# Patient Record
Sex: Female | Born: 1950 | Race: Black or African American | Hispanic: No | Marital: Single | Smoking: Never smoker
Health system: Southern US, Community
[De-identification: ages and names within clinical notes are randomized; demographics above are authoritative.]

## PROBLEM LIST (undated history)

## (undated) DIAGNOSIS — I10 Essential (primary) hypertension: Secondary | ICD-10-CM

## (undated) HISTORY — PX: ABDOMINAL HYSTERECTOMY: SHX81

---

## 2008-09-30 ENCOUNTER — Encounter (INDEPENDENT_AMBULATORY_CARE_PROVIDER_SITE_OTHER): Payer: Self-pay | Admitting: *Deleted

## 2008-11-11 ENCOUNTER — Ambulatory Visit: Payer: Self-pay | Admitting: Family Medicine

## 2008-11-11 ENCOUNTER — Encounter: Payer: Self-pay | Admitting: Family Medicine

## 2008-11-11 DIAGNOSIS — I10 Essential (primary) hypertension: Secondary | ICD-10-CM

## 2008-11-11 LAB — CONVERTED CEMR LAB
ALT: 11 units/L (ref 0–35)
AST: 17 units/L (ref 0–37)
Albumin: 4.2 g/dL (ref 3.5–5.2)
Alkaline Phosphatase: 62 units/L (ref 39–117)
Potassium: 4.5 meq/L (ref 3.5–5.3)
Sodium: 140 meq/L (ref 135–145)
Total Protein: 7.3 g/dL (ref 6.0–8.3)

## 2008-11-12 ENCOUNTER — Telehealth: Payer: Self-pay | Admitting: Family Medicine

## 2008-11-12 ENCOUNTER — Encounter: Payer: Self-pay | Admitting: Family Medicine

## 2008-11-12 DIAGNOSIS — I10 Essential (primary) hypertension: Secondary | ICD-10-CM | POA: Insufficient documentation

## 2008-11-13 ENCOUNTER — Telehealth: Payer: Self-pay | Admitting: *Deleted

## 2008-11-17 ENCOUNTER — Encounter: Admission: RE | Admit: 2008-11-17 | Discharge: 2008-11-17 | Payer: Self-pay | Admitting: Family Medicine

## 2008-12-15 ENCOUNTER — Encounter: Payer: Self-pay | Admitting: Family Medicine

## 2010-07-24 ENCOUNTER — Encounter: Payer: Self-pay | Admitting: *Deleted

## 2012-10-18 ENCOUNTER — Other Ambulatory Visit: Payer: Self-pay

## 2012-10-18 ENCOUNTER — Other Ambulatory Visit (HOSPITAL_COMMUNITY)
Admission: RE | Admit: 2012-10-18 | Discharge: 2012-10-18 | Disposition: A | Discharge status: Home / Self Care | Payer: BC Managed Care – PPO | Source: Ambulatory Visit | Attending: Family Medicine | Admitting: Family Medicine

## 2012-10-18 DIAGNOSIS — E8940 Asymptomatic postprocedural ovarian failure: Secondary | ICD-10-CM

## 2012-10-18 DIAGNOSIS — Z1231 Encounter for screening mammogram for malignant neoplasm of breast: Secondary | ICD-10-CM

## 2012-10-18 DIAGNOSIS — Z0000 Encounter for general adult medical examination without abnormal findings: Secondary | ICD-10-CM | POA: Insufficient documentation

## 2012-10-21 ENCOUNTER — Other Ambulatory Visit: Payer: Self-pay | Admitting: Family Medicine

## 2012-10-21 DIAGNOSIS — E8940 Asymptomatic postprocedural ovarian failure: Secondary | ICD-10-CM

## 2012-11-22 ENCOUNTER — Other Ambulatory Visit: Payer: Self-pay

## 2012-11-22 ENCOUNTER — Ambulatory Visit: Payer: Self-pay

## 2012-12-02 ENCOUNTER — Ambulatory Visit
Admission: RE | Admit: 2012-12-02 | Discharge: 2012-12-02 | Disposition: A | Discharge status: Home / Self Care | Payer: BC Managed Care – PPO | Source: Ambulatory Visit | Attending: Family Medicine | Admitting: Family Medicine

## 2012-12-02 ENCOUNTER — Ambulatory Visit
Admission: RE | Admit: 2012-12-02 | Discharge: 2012-12-02 | Disposition: A | Discharge status: Home / Self Care | Payer: BC Managed Care – PPO | Source: Ambulatory Visit

## 2012-12-02 DIAGNOSIS — E8940 Asymptomatic postprocedural ovarian failure: Secondary | ICD-10-CM

## 2012-12-02 DIAGNOSIS — Z1231 Encounter for screening mammogram for malignant neoplasm of breast: Secondary | ICD-10-CM

## 2013-12-19 ENCOUNTER — Other Ambulatory Visit: Payer: Self-pay

## 2013-12-19 DIAGNOSIS — Z1231 Encounter for screening mammogram for malignant neoplasm of breast: Secondary | ICD-10-CM

## 2013-12-25 ENCOUNTER — Ambulatory Visit
Admission: RE | Admit: 2013-12-25 | Discharge: 2013-12-25 | Disposition: A | Discharge status: Home / Self Care | Payer: BC Managed Care – PPO | Source: Ambulatory Visit

## 2013-12-25 DIAGNOSIS — Z1231 Encounter for screening mammogram for malignant neoplasm of breast: Secondary | ICD-10-CM

## 2015-07-02 ENCOUNTER — Other Ambulatory Visit: Payer: Self-pay

## 2015-07-02 DIAGNOSIS — Z1231 Encounter for screening mammogram for malignant neoplasm of breast: Secondary | ICD-10-CM

## 2015-07-22 ENCOUNTER — Ambulatory Visit
Admission: RE | Admit: 2015-07-22 | Discharge: 2015-07-22 | Disposition: A | Discharge status: Home / Self Care | Payer: Commercial Managed Care - HMO | Source: Ambulatory Visit

## 2015-07-22 DIAGNOSIS — Z1231 Encounter for screening mammogram for malignant neoplasm of breast: Secondary | ICD-10-CM

## 2016-07-31 DIAGNOSIS — Z2821 Immunization not carried out because of patient refusal: Secondary | ICD-10-CM | POA: Insufficient documentation

## 2017-07-03 ENCOUNTER — Other Ambulatory Visit: Payer: Self-pay | Admitting: Family Medicine

## 2017-07-03 DIAGNOSIS — M25561 Pain in right knee: Secondary | ICD-10-CM

## 2017-07-09 ENCOUNTER — Ambulatory Visit
Admission: RE | Admit: 2017-07-09 | Discharge: 2017-07-09 | Disposition: A | Discharge status: Home / Self Care | Payer: Medicare HMO | Source: Ambulatory Visit | Attending: Family Medicine | Admitting: Family Medicine

## 2017-07-09 DIAGNOSIS — M25561 Pain in right knee: Secondary | ICD-10-CM

## 2018-04-12 ENCOUNTER — Other Ambulatory Visit: Payer: Self-pay | Admitting: Family Medicine

## 2018-04-12 DIAGNOSIS — E2839 Other primary ovarian failure: Secondary | ICD-10-CM

## 2018-04-12 DIAGNOSIS — Z1231 Encounter for screening mammogram for malignant neoplasm of breast: Secondary | ICD-10-CM

## 2018-06-20 ENCOUNTER — Ambulatory Visit
Admission: RE | Admit: 2018-06-20 | Discharge: 2018-06-20 | Disposition: A | Discharge status: Home / Self Care | Payer: Medicare HMO | Source: Ambulatory Visit | Attending: Family Medicine | Admitting: Family Medicine

## 2018-06-20 ENCOUNTER — Ambulatory Visit: Payer: Medicare HMO

## 2018-06-20 DIAGNOSIS — E2839 Other primary ovarian failure: Secondary | ICD-10-CM

## 2018-06-20 DIAGNOSIS — Z1231 Encounter for screening mammogram for malignant neoplasm of breast: Secondary | ICD-10-CM

## 2018-11-25 ENCOUNTER — Other Ambulatory Visit: Payer: Self-pay | Admitting: *Deleted

## 2018-11-25 DIAGNOSIS — Z20822 Contact with and (suspected) exposure to covid-19: Secondary | ICD-10-CM

## 2018-11-30 LAB — NOVEL CORONAVIRUS, NAA: SARS-CoV-2, NAA: NOT DETECTED

## 2018-12-04 ENCOUNTER — Telehealth: Payer: Self-pay | Admitting: Family Medicine

## 2018-12-04 NOTE — Telephone Encounter (Signed)
Patient advised that her COVID 19 test results came back negative.

## 2019-06-04 ENCOUNTER — Encounter: Payer: Self-pay | Admitting: Physical Therapy

## 2019-06-04 ENCOUNTER — Ambulatory Visit: Payer: Medicare HMO | Attending: Family Medicine | Admitting: Physical Therapy

## 2019-06-04 ENCOUNTER — Other Ambulatory Visit: Payer: Self-pay

## 2019-06-04 DIAGNOSIS — M25512 Pain in left shoulder: Secondary | ICD-10-CM | POA: Insufficient documentation

## 2019-06-04 DIAGNOSIS — M25612 Stiffness of left shoulder, not elsewhere classified: Secondary | ICD-10-CM | POA: Insufficient documentation

## 2019-06-04 NOTE — Therapy (Signed)
[facility] [address] [place], [place], [address] Phone: [phone]   Fax:  [phone]  Physical Therapy Evaluation  Patient Details  Name: Robin Scott MRN: [id number] Date of Birth: March 06, 1951 Referring Provider (PT): TL Leavy Cella   Encounter Date: 06/04/2019  PT End of Session - 06/04/19 1549    Visit Number  1    Date for PT Re-Evaluation  08/04/19    Authorization Type  [employer]    PT Start Time  1350    PT Stop Time  1440    PT Time Calculation (min)  50 min    Activity Tolerance  Patient tolerated treatment well    Behavior During Therapy  [facility] for tasks assessed/performed       History reviewed. No pertinent past medical history.  History reviewed. No pertinent surgical history.  There were no vitals filed for this visit.   Subjective Assessment - 06/04/19 1406    Subjective  Patient reports a fall about 3 weeks, she reports a trip and injured her left shoulder, she reports that her left upper arm was very bruised.  She did not have an MRI or x-ray.  She reports having a little bit of difficulty dressing and doing hair    Limitations  Lifting;House hold activities    Patient Stated Goals  no pain, better ROM, do everything ADL's without difficulty    Currently in Pain?  Yes    Pain Score  3     Pain Location  Shoulder    Pain Orientation  Left;Lateral    Pain Descriptors / Indicators  Aching    Pain Type  Acute pain    Pain Radiating Towards  denies    Pain Onset  1 to 4 weeks ago    Pain Frequency  Constant    Aggravating Factors   reaching behind, reaching out pain up to 5/10    Pain Relieving Factors  rest helps  at best pain a 2/10    Effect of Pain on Daily Activities  difficulty with ADL's         [facility] PT Assessment - 06/04/19 0001      Assessment   Medical Diagnosis  left shoulder pain    Referring Provider (PT)  TL Leavy Cella    Onset Date/Surgical Date  05/14/19    Hand Dominance   Left      Precautions   Precautions  None      Balance Screen   Has the patient fallen in the past 6 months  Yes    How many times?  1    Has the patient had a decrease in activity level because of a fear of falling?   No    Is the patient reluctant to leave their home because of a fear of falling?   No      Home Environment   Additional Comments  does some housework      Prior Function   Level of Independence  Independent    Vocation  Retired    Leisure  was working out a gym 3x/week      Press photographer Comments  rounded shoulder, fwd head      ROM / Strength   AROM / PROM / Strength  AROM;PROM;Strength      AROM   AROM Assessment Site  Shoulder    Right/Left Shoulder  Left    Left Shoulder Flexion  120 Degrees  Left Shoulder ABduction  90 Degrees    Left Shoulder Internal Rotation  60 Degrees    Left Shoulder External Rotation  70 Degrees      PROM   PROM Assessment Site  Shoulder    Right/Left Shoulder  Left    Left Shoulder Flexion  130 Degrees    Left Shoulder ABduction  120 Degrees    Left Shoulder Internal Rotation  70 Degrees    Left Shoulder External Rotation  80 Degrees      Strength   Overall Strength Comments  ER/IR 4/5, 3+/5 for flexion and abduction      Palpation   Palpation comment  no real tender areas, some in the lateral arm      Special Tests   Other special tests  she was able to give good resistance with the empty can test                Objective measurements completed on examination: See above findings.              PT Education - 06/04/19 1549    Education Details  HEP for AAROM to help with ROM    Person(s) Educated  Patient    Methods  Explanation;Demonstration;Tactile cues;Verbal cues;Handout    Comprehension  Verbalized understanding;Returned demonstration;Verbal cues required       PT Short Term Goals - 06/04/19 1557      PT SHORT TERM GOAL #1   Title  independent with initial HEP     Time  2    Period  Weeks    Status  New        PT Long Term Goals - 06/04/19 1558      PT LONG TERM GOAL #1   Title  understand posture and body mechanics    Time  8    Period  Weeks    Status  New      PT LONG TERM GOAL #2   Title  increase AROM of the left shoulder flexion to 150 degrees    Time  8    Period  Weeks    Status  New      PT LONG TERM GOAL #3   Title  report no difficulty doing her hair    Time  8    Period  Weeks    Status  New      PT LONG TERM GOAL #4   Title  increase left shoulder abduction to 140 degrees    Time  8    Period  Weeks    Status  New             Plan - 06/04/19 1553    Clinical Impression Statement  Patient had a fall about 3 weeks ago, she fell onto her outstretched left arm, she reports some significant bruising in the left upper arm, she is left handed, she reports pain and a loss of ROM, she did not have x-rays or MRI.  She has some limitation in ROM but mostly with flexio and abduction, there is a lot of upper trap compensation to raise the arm, she was able to give good resistance with the arm at her side for ER/IR, empty can test was fair.  I feel like it is significant strain but feel like the ER/Ir is good and the empty can test was good as well so hopefull no RTC    Stability/Clinical Decision Making  Evolving/Moderate complexity    Clinical  Decision Making  Low    Rehab Potential  Good    PT Frequency  1x / week    PT Duration  8 weeks    PT Treatment/Interventions  ADLs/Self Care Home Management;Cryotherapy;Electrical Stimulation;Iontophoresis 4mg /ml Dexamethasone;Moist Heat;Ultrasound;Therapeutic activities;Therapeutic exercise;Manual techniques;Patient/family education    PT Next Visit Plan  work on stabilization and function    Consulted and Agree with Plan of Care  Patient       Patient will benefit from skilled therapeutic intervention in order to improve the following deficits and impairments:  Pain, Improper  body mechanics, Increased muscle spasms, Postural dysfunction, Decreased range of motion, Decreased strength, Impaired UE functional use  Visit Diagnosis: Acute pain of left shoulder - Plan: PT plan of care cert/re-cert  Stiffness of left shoulder, not elsewhere classified - Plan: PT plan of care cert/re-cert     Problem List Patient Active Problem List   Diagnosis Date Noted  . HYPERTENSION 11/12/2008  . HYPERTENSION, BENIGN ESSENTIAL 11/11/2008    Jearld LeschALBRIGHT,Lealer Marsland W., PT 06/04/2019, 4:03 PM  [facility] [address] [place], [place], [address] Phone: [phone]   Fax:  [phone]  Name: Lonell GrandchildValerie Studstill MRN: [id number] Date of Birth: [birth year]

## 2019-06-11 ENCOUNTER — Encounter: Payer: Medicare HMO | Admitting: Physical Therapy

## 2019-06-17 ENCOUNTER — Ambulatory Visit: Payer: Medicare HMO | Attending: Family Medicine | Admitting: Physical Therapy

## 2019-06-17 ENCOUNTER — Encounter: Payer: Self-pay | Admitting: Physical Therapy

## 2019-06-17 ENCOUNTER — Other Ambulatory Visit: Payer: Self-pay

## 2019-06-17 DIAGNOSIS — M25612 Stiffness of left shoulder, not elsewhere classified: Secondary | ICD-10-CM | POA: Insufficient documentation

## 2019-06-17 DIAGNOSIS — M25512 Pain in left shoulder: Secondary | ICD-10-CM | POA: Insufficient documentation

## 2019-06-17 NOTE — Therapy (Signed)
[place], [place], [address] Phone: [phone]   Fax:  [phone]  Physical Therapy Treatment  Patient Details  Name: Robin Scott MRN: [id number] Date of Birth: 1950/09/16 Referring Provider (PT): TL Luciana Axe   Encounter Date: 06/17/2019  PT End of Session - 06/17/19 1428    Visit Number  2    Date for PT Re-Evaluation  08/04/19    Authorization Type  [employer]    PT Start Time  1312    Activity Tolerance  Patient tolerated treatment well    Behavior During Therapy  [facility] for tasks assessed/performed       History reviewed. No pertinent past medical history.  History reviewed. No pertinent surgical history.  There were no vitals filed for this visit.  Subjective Assessment - 06/17/19 1317    Subjective  Patient reports that the mobility is a little better, still in pain    Currently in Pain?  Yes    Pain Score  4     Pain Location  Shoulder    Pain Orientation  Left;Lateral    Aggravating Factors   taking on  and off jacket                       [facility] Adult PT Treatment/Exercise - 06/17/19 0001      Exercises   Exercises  Shoulder      Shoulder Exercises: Standing   External Rotation  Both;20 reps;Theraband    Theraband Level (Shoulder External Rotation)  Level 2 (Red)    Internal Rotation  AAROM;20 reps    Internal Rotation Limitations  with wand behind back to bra    Extension  AAROM;20 reps    Theraband Level (Shoulder Extension)  Level 2 (Red)   20 reps   Extension Limitations  some assist for end range    Row  Both;20 reps;Theraband    Theraband Level (Shoulder Row)  Level 2 (Red)      Shoulder Exercises: ROM/Strengthening   UBE (Upper Arm Bike)  level 1 x 4 minutes    Wall Wash  flexion, CW/CCW circles    Thumb Tacks         Manual Therapy   Manual Therapy  Soft tissue mobilization;Passive ROM    Soft tissue mobilization  to the left upper trap and  into the neck    Passive ROM  left shoulder all motions to end range               PT Short Term Goals - 06/17/19 1438      PT SHORT TERM GOAL #1   Title  independent with initial HEP    Status  Partially Met        PT Long Term Goals - 06/04/19 1558      PT LONG TERM GOAL #1   Title  understand posture and body mechanics    Time  8    Period  Weeks    Status  New      PT LONG TERM GOAL #2   Title  increase AROM of the left shoulder flexion to 150 degrees    Time  8    Period  Weeks    Status  New      PT LONG TERM GOAL #3   Title  report no difficulty doing her hair    Time  8    Period  Weeks  Status  New      PT LONG TERM GOAL #4   Title  increase left shoulder abduction to 140 degrees    Time  8    Period  Weeks    Status  New            Plan - 06/17/19 1428    Clinical Impression Statement  Patient reports that the STM felt good, she is very tight and tends to elevate the shoulders, needs a lot of cues to not elevate the shoulders.  Has significant knots in the left upper trap    PT Next Visit Plan  work on stabilization and function    Consulted and Agree with Plan of Care  Patient       Patient will benefit from skilled therapeutic intervention in order to improve the following deficits and impairments:  Pain, Improper body mechanics, Increased muscle spasms, Postural dysfunction, Decreased range of motion, Decreased strength, Impaired UE functional use  Visit Diagnosis: Acute pain of left shoulder  Stiffness of left shoulder, not elsewhere classified     Problem List Patient Active Problem List   Diagnosis Date Noted  . HYPERTENSION 11/12/2008  . HYPERTENSION, BENIGN ESSENTIAL 11/11/2008    Sumner Boast., PT 06/17/2019, 2:39 PM  [place] [address] [place], [place], [address] Phone: [phone]   Fax:  [phone]  Name: Robin Scott MRN:  [id number] Date of Birth: 26-Aug-1950

## 2019-06-18 ENCOUNTER — Encounter: Payer: Medicare HMO | Admitting: Physical Therapy

## 2019-06-24 ENCOUNTER — Other Ambulatory Visit: Payer: Self-pay

## 2019-06-24 ENCOUNTER — Encounter: Payer: Self-pay | Admitting: Physical Therapy

## 2019-06-24 ENCOUNTER — Ambulatory Visit: Payer: Medicare HMO | Admitting: Physical Therapy

## 2019-06-24 DIAGNOSIS — M25512 Pain in left shoulder: Secondary | ICD-10-CM | POA: Diagnosis not present

## 2019-06-24 DIAGNOSIS — M25612 Stiffness of left shoulder, not elsewhere classified: Secondary | ICD-10-CM

## 2019-06-24 NOTE — Therapy (Signed)
[place], [place], [address] Phone: [phone]   Fax:  [phone]  Physical Therapy Treatment  Patient Details  Name: Robin Scott MRN: [id number] Date of Birth: 1950/12/13 Referring Provider (PT): TL Luciana Axe   Encounter Date: 06/24/2019  PT End of Session - 06/24/19 1408    Visit Number  3    Date for PT Re-Evaluation  08/04/19    Authorization Type  [employer]    PT Start Time  1315    PT Stop Time  1415    PT Time Calculation (min)  60 min    Activity Tolerance  Patient tolerated treatment well    Behavior During Therapy  [facility] for tasks assessed/performed       History reviewed. No pertinent past medical history.  History reviewed. No pertinent surgical history.  There were no vitals filed for this visit.  Subjective Assessment - 06/24/19 1323    Subjective  Patient reports that she is a little more sore and pain in the left upper trap and the neck area, reports at times pain in th eleft upper arm    Currently in Pain?  Yes    Pain Score  4     Pain Location  Shoulder    Pain Orientation  Left    Pain Descriptors / Indicators  Tightness    Pain Radiating Towards  some pain in the left upper arm    Aggravating Factors   taking off clothes         [facility] PT Assessment - 06/24/19 0001      AROM   Left Shoulder Flexion  128 Degrees    Left Shoulder ABduction  110 Degrees    Left Shoulder Internal Rotation  60 Degrees    Left Shoulder External Rotation  70 Degrees                   [facility] Adult PT Treatment/Exercise - 06/24/19 0001      Shoulder Exercises: Standing   Internal Rotation  AAROM;20 reps    Internal Rotation Limitations  with wand behind back to bra    Extension  AAROM;20 reps    Theraband Level (Shoulder Extension)  Level 2 (Red)    Extension Limitations  some assist for end range with wand behind the back      Shoulder Exercises: ROM/Strengthening   UBE (Upper Arm Bike)  level 1 x 4 minutes    Lat Pull  1.5 plate;20 reps    Cybex Row  1.5 plate;10 reps    Wall Wash  flexion, CW/CCW circles    Other ROM/Strengthening Exercises  5# straight arm pulls      Modalities   Modalities  Electrical Stimulation;Moist Heat      Moist Heat Therapy   Number Minutes Moist Heat  15 Minutes    Moist Heat Location  Shoulder      Electrical Stimulation   Electrical Stimulation Location  left upper trap    Electrical Stimulation Action  IFC    Electrical Stimulation Parameters  supine    Electrical Stimulation Goals  Pain      Manual Therapy   Manual Therapy  Soft tissue mobilization;Passive ROM    Soft tissue mobilization  to the left upper trap and into the neck    Passive ROM  left shoulder all motions to end range, with some contract relax  PT Short Term Goals - 06/17/19 1438      PT SHORT TERM GOAL #1   Title  independent with initial HEP    Status  Partially Met        PT Long Term Goals - 06/24/19 1520      PT LONG TERM GOAL #1   Title  understand posture and body mechanics    Status  On-going      PT LONG TERM GOAL #2   Title  increase AROM of the left shoulder flexion to 150 degrees    Status  On-going            Plan - 06/24/19 1518    Clinical Impression Statement  Patient reports that she continue to have the tightness in the upper trap, her ROM for flexion and abduction is much improved.  She struggles with the rotation motions.  I tried some contract relax and added the estim    PT Next Visit Plan  gave info on DN, see how she is progressing, the trigger point is the issue    Consulted and Agree with Plan of Care  Patient       Patient will benefit from skilled therapeutic intervention in order to improve the following deficits and impairments:  Pain, Improper body mechanics, Increased muscle spasms, Postural dysfunction, Decreased range of motion, Decreased strength, Impaired UE  functional use  Visit Diagnosis: Acute pain of left shoulder  Stiffness of left shoulder, not elsewhere classified     Problem List Patient Active Problem List   Diagnosis Date Noted  . HYPERTENSION 11/12/2008  . HYPERTENSION, BENIGN ESSENTIAL 11/11/2008    Sumner Boast., PT 06/24/2019, 3:27 PM  [place] [address] [place], [place], [address] Phone: [phone]   Fax:  [phone]  Name: Raiden Yearwood MRN: [id number] Date of Birth: 09-05-1950

## 2019-06-24 NOTE — Patient Instructions (Signed)

## 2019-07-01 ENCOUNTER — Other Ambulatory Visit: Payer: Self-pay

## 2019-07-01 ENCOUNTER — Ambulatory Visit: Payer: Medicare HMO | Admitting: Physical Therapy

## 2019-07-01 ENCOUNTER — Encounter: Payer: Self-pay | Admitting: Physical Therapy

## 2019-07-01 DIAGNOSIS — M25612 Stiffness of left shoulder, not elsewhere classified: Secondary | ICD-10-CM

## 2019-07-01 DIAGNOSIS — M25512 Pain in left shoulder: Secondary | ICD-10-CM

## 2019-07-01 NOTE — Therapy (Signed)
[place], [place], [address] Phone: [phone]   Fax:  [phone]  Physical Therapy Treatment  Patient Details  Name: Robin Scott MRN: [id number] Date of Birth: 1950/12/01 Referring Provider (PT): TL Luciana Axe   Encounter Date: 07/01/2019  PT End of Session - 07/01/19 1521    Visit Number  4    Date for PT Re-Evaluation  08/04/19    Authorization Type  [employer]    PT Start Time  0626    PT Stop Time  1542    PT Time Calculation (min)  58 min    Activity Tolerance  Patient tolerated treatment well    Behavior During Therapy  [facility] for tasks assessed/performed       History reviewed. No pertinent past medical history.  History reviewed. No pertinent surgical history.  There were no vitals filed for this visit.  Subjective Assessment - 07/01/19 1451    Subjective  Patient reports that she feels like her shoulder is improving but is having more left upper trap pain and pain in the left side of the neck    Currently in Pain?  Yes    Pain Score  4     Pain Location  Neck    Pain Orientation  Left    Pain Relieving Factors  patient feels like what we have been doing has helped                       [facility] Adult PT Treatment/Exercise - 07/01/19 0001      Shoulder Exercises: Standing   External Rotation  Both;20 reps;Theraband    Theraband Level (Shoulder External Rotation)  Level 2 (Red)    Internal Rotation  AAROM;20 reps    Internal Rotation Limitations  with wand behind back to bra    Extension  AAROM;20 reps    Theraband Level (Shoulder Extension)  Level 2 (Red)    Extension Limitations  some assist for end range with wand behind the back    Row  Both;20 reps;Theraband    Theraband Level (Shoulder Row)  Level 2 (Red)    Other Standing Exercises  4# shrugs with upper trap and levator stretches      Shoulder Exercises: ROM/Strengthening   Nustep  level 3 x 5 minutes    Wall Wash  flexion, CW/CCW circles      Moist Heat Therapy   Number Minutes Moist Heat  15 Minutes    Moist Heat Location  Shoulder      Electrical Stimulation   Electrical Stimulation Location  left upper trap into the left shoulder    Electrical Stimulation Action  IFC    Electrical Stimulation Parameters  sitting    Electrical Stimulation Goals  Pain      Manual Therapy   Manual Therapy  Soft tissue mobilization;Passive ROM    Soft tissue mobilization  to the left upper trap and into the neck    Passive ROM  left shoulder all motions to end range, with some contract relax, did some very gentle contract realax and some passive neck stretches               PT Short Term Goals - 06/17/19 1438      PT SHORT TERM GOAL #1   Title  independent with initial HEP    Status  Partially Met        PT Long Term Goals -  07/01/19 1523      PT LONG TERM GOAL #1   Title  understand posture and body mechanics    Status  Partially Met      PT LONG TERM GOAL #2   Title  increase AROM of the left shoulder flexion to 150 degrees    Status  On-going      PT LONG TERM GOAL #3   Title  report no difficulty doing her hair    Status  Partially Met            Plan - 07/01/19 1521    Clinical Impression Statement  shoulder ROM is really improving, the neck is hurting more now, she is very tight and tender in the left upper trap, the left cervical area, she is tight and needed to be gentle with the stretches, she tolerated the shoulder ROM and contract relax very well    PT Next Visit Plan  continue to work on the upper trap and neck area as well as the ROM of the left shoulder    Consulted and Agree with Plan of Care  Patient       Patient will benefit from skilled therapeutic intervention in order to improve the following deficits and impairments:  Pain, Improper body mechanics, Increased muscle spasms, Postural dysfunction, Decreased range of motion, Decreased strength,  Impaired UE functional use  Visit Diagnosis: Acute pain of left shoulder  Stiffness of left shoulder, not elsewhere classified     Problem List Patient Active Problem List   Diagnosis Date Noted  . HYPERTENSION 11/12/2008  . HYPERTENSION, BENIGN ESSENTIAL 11/11/2008    Sumner Boast., PT 07/01/2019, 3:24 PM  [place] [address] [place], [place], [address] Phone: [phone]   Fax:  [phone]  Name: Robin Scott MRN: [id number] Date of Birth: 1950-07-26

## 2019-07-07 ENCOUNTER — Encounter: Payer: Medicare HMO | Admitting: Physical Therapy

## 2019-07-07 ENCOUNTER — Ambulatory Visit: Payer: Medicare HMO | Attending: Family Medicine | Admitting: Physical Therapy

## 2019-07-07 ENCOUNTER — Encounter: Payer: Self-pay | Admitting: Physical Therapy

## 2019-07-07 ENCOUNTER — Other Ambulatory Visit: Payer: Self-pay

## 2019-07-07 DIAGNOSIS — M25612 Stiffness of left shoulder, not elsewhere classified: Secondary | ICD-10-CM | POA: Insufficient documentation

## 2019-07-07 DIAGNOSIS — M25512 Pain in left shoulder: Secondary | ICD-10-CM

## 2019-07-07 NOTE — Therapy (Signed)
[place], [place], [address] Phone: [phone]   Fax:  [phone]  Physical Therapy Treatment  Patient Details  Name: Robin Scott MRN: [id number] Date of Birth: 04/15/1951 Referring Provider (PT): TL Luciana Axe   Encounter Date: 07/07/2019  PT End of Session - 07/07/19 1451    Visit Number  5    Date for PT Re-Evaluation  08/04/19    Authorization Type  [employer]    PT Start Time  3833    PT Stop Time  1458    PT Time Calculation (min)  63 min    Activity Tolerance  Patient tolerated treatment well    Behavior During Therapy  [facility] for tasks assessed/performed       History reviewed. No pertinent past medical history.  History reviewed. No pertinent surgical history.  There were no vitals filed for this visit.  Subjective Assessment - 07/07/19 1353    Subjective  Stiff and sore,    Currently in Pain?  Yes    Pain Score  4     Pain Location  Neck    Pain Orientation  Left    Pain Descriptors / Indicators  Tightness    Aggravating Factors   the neck stretch seemed to make it a little worse         [facility] PT Assessment - 07/07/19 0001      AROM   Left Shoulder Flexion  128 Degrees    Left Shoulder ABduction  110 Degrees    Left Shoulder Internal Rotation  60 Degrees    Left Shoulder External Rotation  70 Degrees      PROM   Left Shoulder Flexion  145 Degrees    Left Shoulder ABduction  130 Degrees                   [facility] Adult PT Treatment/Exercise - 07/07/19 0001      Shoulder Exercises: ROM/Strengthening   Nustep  level 3 x 6 minutes    Lat Pull  1.5 plate;20 reps    Wall Wash  flexion, CW/CCW circles    Other ROM/Strengthening Exercises  5# straight arm pulls      Moist Heat Therapy   Number Minutes Moist Heat  15 Minutes    Moist Heat Location  Shoulder      Electrical Stimulation   Electrical Stimulation Location  left upper trap into the left shoulder     Electrical Stimulation Action  IFC    Electrical Stimulation Parameters  supine    Electrical Stimulation Goals  Pain      Manual Therapy   Manual Therapy  Soft tissue mobilization;Passive ROM;Joint mobilization    Joint Mobilization  gentle grade III all GH motions    Soft tissue mobilization  to the left upper trap and into the neck    Passive ROM  left shoulder all motions to end range, with some contract relax, did some very gentle contract realax and some passive neck stretches               PT Short Term Goals - 07/07/19 1453      PT SHORT TERM GOAL #1   Title  independent with initial HEP    Status  Achieved        PT Long Term Goals - 07/07/19 1454      PT LONG TERM GOAL #1   Title  understand posture and body mechanics  Status  Partially Met      PT LONG TERM GOAL #2   Title  increase AROM of the left shoulder flexion to 150 degrees    Status  On-going      PT LONG TERM GOAL #3   Title  report no difficulty doing her hair    Status  Partially Met      PT LONG TERM GOAL #4   Title  increase left shoulder abduction to 140 degrees    Status  On-going            Plan - 07/07/19 1452    Clinical Impression Statement  Patient having less neck pain today, ROM was measured actively, it did not improve over the past 10 days, however her PROM is improving, she is still very tight in the shoulder, tolerate the contract relax stretches well as well as the joint mobs    PT Next Visit Plan  work on the [employer] and Agree with Plan of Care  Patient       Patient will benefit from skilled therapeutic intervention in order to improve the following deficits and impairments:  Pain, Improper body mechanics, Increased muscle spasms, Postural dysfunction, Decreased range of motion, Decreased strength, Impaired UE functional use  Visit Diagnosis: Acute pain of left shoulder  Stiffness of left shoulder, not elsewhere classified     Problem  List Patient Active Problem List   Diagnosis Date Noted  . HYPERTENSION 11/12/2008  . HYPERTENSION, BENIGN ESSENTIAL 11/11/2008    Sumner Boast., PT 07/07/2019, 2:55 PM  [place], [place], [address] Phone: [phone]   Fax:  [phone]  Name: Robin Scott MRN: [id number] Date of Birth: [birth year]

## 2019-07-17 ENCOUNTER — Ambulatory Visit: Payer: Medicare HMO | Admitting: Physical Therapy

## 2019-07-22 ENCOUNTER — Ambulatory Visit: Payer: Medicare HMO | Admitting: Physical Therapy

## 2019-07-29 ENCOUNTER — Ambulatory Visit: Payer: Medicare HMO | Admitting: Physical Therapy

## 2019-08-20 ENCOUNTER — Ambulatory Visit: Payer: Medicare HMO

## 2019-09-05 ENCOUNTER — Other Ambulatory Visit: Payer: Self-pay | Admitting: Family Medicine

## 2019-09-05 DIAGNOSIS — Z1231 Encounter for screening mammogram for malignant neoplasm of breast: Secondary | ICD-10-CM

## 2019-09-09 ENCOUNTER — Encounter (HOSPITAL_COMMUNITY): Payer: Self-pay | Admitting: Emergency Medicine

## 2019-09-09 ENCOUNTER — Other Ambulatory Visit: Payer: Self-pay

## 2019-09-09 DIAGNOSIS — R519 Headache, unspecified: Secondary | ICD-10-CM | POA: Diagnosis not present

## 2019-09-09 DIAGNOSIS — I10 Essential (primary) hypertension: Secondary | ICD-10-CM | POA: Insufficient documentation

## 2019-09-09 DIAGNOSIS — Z79899 Other long term (current) drug therapy: Secondary | ICD-10-CM | POA: Insufficient documentation

## 2019-09-09 NOTE — ED Triage Notes (Addendum)
Per EMS, patient from home, c/o headache x3 hours. Hx HTN. Reports taking BP meds as prescribed. Took first dose of prednisone tonight. Denies blurred vision, dizziness.   160/130 with EMS  States headache started after taking prednisone.

## 2019-09-10 ENCOUNTER — Emergency Department (HOSPITAL_COMMUNITY)
Admission: EM | Admit: 2019-09-10 | Discharge: 2019-09-10 | Disposition: A | Discharge status: Home / Self Care | Payer: Medicare HMO | Attending: Emergency Medicine | Admitting: Emergency Medicine

## 2019-09-10 DIAGNOSIS — R519 Headache, unspecified: Secondary | ICD-10-CM

## 2019-09-10 HISTORY — DX: Essential (primary) hypertension: I10

## 2019-09-10 MED ORDER — DIPHENHYDRAMINE HCL 50 MG/ML IJ SOLN
12.5000 mg | Freq: Once | INTRAMUSCULAR | Status: AC
  Administered 2019-09-10: 12.5 mg via INTRAVENOUS
  Filled 2019-09-10: qty 1

## 2019-09-10 MED ORDER — PROCHLORPERAZINE EDISYLATE 10 MG/2ML IJ SOLN
10.0000 mg | Freq: Once | INTRAMUSCULAR | Status: AC
  Administered 2019-09-10: 10 mg via INTRAVENOUS
  Filled 2019-09-10: qty 2

## 2019-09-10 NOTE — Discharge Instructions (Addendum)
You were seen today for headache.  You improved with Benadryl and a headache cocktail.  Monitor your symptoms closely at home.  This may or may not be related to your prednisone use.  If you do not tolerate prednisone, you can discontinue use.  Follow-up closely with your primary physician.

## 2019-09-10 NOTE — ED Provider Notes (Signed)
[facility]-EMERGENCY DEPT Provider Note   CSN: [id number] Arrival date & time: 09/09/19  2234     History Chief Complaint  Patient presents with  . Headache    Masiah Lewing is a 69 y.o. female.  HPI     This is a 69 year old female who presents with headache.  Patient with a history of hypertension.  Patient reports that she started prednisone yesterday for a rash on her face.  She has never taken prednisone before.  She had progressively worsening occipital headache.  She states that she does not normally get headaches.  She denies any weakness, numbness, strokelike symptoms, vision changes, dizziness.  She did not take anything for her headache.  She denies any fevers, neck stiffness.  Denies worst headache of her life.  Does have a history of hypertension for which she takes lisinopril.  Currently she rates her pain at 5 out of 10.  It is occipital and nonradiating.  It is dull and achy in nature.  Past Medical History:  Diagnosis Date  . Hypertension     Patient Active Problem List   Diagnosis Date Noted  . Pneumococcal vaccination declined by patient 07/31/2016  . Essential hypertension 11/11/2008    History reviewed. No pertinent surgical history.   OB History   No obstetric history on file.     No family history on file.  Social History   Tobacco Use  . Smoking status: Not on file  Substance Use Topics  . Alcohol use: Not on file  . Drug use: Not on file    Home Medications Prior to Admission medications   Medication Sig Start Date End Date Taking? Authorizing Provider  losartan (COZAAR) 25 MG tablet Take 1 tablet by mouth daily. 06/24/19   [provider]    Allergies    Patient has no known allergies.  Review of Systems   Review of Systems  Constitutional: Negative for fever.  Eyes: Negative for photophobia and visual disturbance.  Respiratory: Negative for shortness of breath.   Cardiovascular: Negative for  chest pain.  Gastrointestinal: Negative for abdominal pain, nausea and vomiting.  Musculoskeletal: Negative for back pain.  Neurological: Positive for headaches. Negative for dizziness, weakness and numbness.  All other systems reviewed and are negative.   Physical Exam Updated Vital Signs BP (!) 173/111   Pulse 86   Temp 98 F (36.7 C) (Oral)   Resp 11   SpO2 97%   Physical Exam Vitals and nursing note reviewed.  Constitutional:      Appearance: She is well-developed.  HENT:     Head: Normocephalic and atraumatic.  Eyes:     Pupils: Pupils are equal, round, and reactive to light.     Comments: Slight swelling and darkened discoloration bilateral eyes  Cardiovascular:     Rate and Rhythm: Normal rate and regular rhythm.     Heart sounds: Normal heart sounds.  Pulmonary:     Effort: Pulmonary effort is normal. No respiratory distress.     Breath sounds: No wheezing.  Abdominal:     General: Bowel sounds are normal.     Palpations: Abdomen is soft.  Musculoskeletal:     Cervical back: Normal range of motion and neck supple.  Skin:    General: Skin is warm and dry.  Neurological:     Mental Status: She is alert and oriented to person, place, and time.     Comments: Cranial nerves II through XII intact, fluent speech, 5  out of 5 strength in all 4 extremities, no dysmetria to finger-nose-finger  Psychiatric:        Mood and Affect: Mood normal.     ED Results / Procedures / Treatments   Labs (all labs ordered are listed, but only abnormal results are displayed) Labs Reviewed - No data to display  EKG None  Radiology No results found.  Procedures Procedures (including critical care time)  Medications Ordered in ED Medications  prochlorperazine (COMPAZINE) injection 10 mg (10 mg Intravenous Given 09/10/19 0200)  diphenhydrAMINE (BENADRYL) injection 12.5 mg (12.5 mg Intravenous Given 09/10/19 0200)    ED Course  I have reviewed the triage vital signs and the  nursing notes.  Pertinent labs & imaging results that were available during my care of the patient were reviewed by me and considered in my medical decision making (see chart for details).    MDM Rules/Calculators/A&P                       Patient presents with onset of headache.  Reports after starting prednisone yesterday.  She is overall nontoxic-appearing.  She is notably hypertensive with initial blood pressure of 179/114.  She is has no neurologic deficits no red flags with the exception of age.  She is neurologically intact.  She does report a rash over the last month around her eyes.  It is itchy.  She has not taken Benadryl or any antihistamines for this.  This is what she was prescribed prednisone for.  Patient was given Benadryl and Compazine.  At this time have lower suspicion for hypertensive urgency or emergency.  Feel that subarachnoid hemorrhage is unlikely.  2:50 AM On recheck, patient states she feels much better and headache is a 0.  Recommend close follow-up with her primary physician.  After history, exam, and medical workup I feel the patient has been appropriately medically screened and is safe for discharge home. Pertinent diagnoses were discussed with the patient. Patient was given return precautions.   Final Clinical Impression(s) / ED Diagnoses Final diagnoses:  Bad headache    Rx / DC Orders ED Discharge Orders    None       Norma Montemurro, Barbette Hair, MD 09/10/19 [phone]

## 2019-09-26 ENCOUNTER — Ambulatory Visit: Payer: Medicare HMO

## 2019-10-21 ENCOUNTER — Ambulatory Visit
Admission: RE | Admit: 2019-10-21 | Discharge: 2019-10-21 | Disposition: A | Discharge status: Home / Self Care | Payer: Medicare HMO | Source: Ambulatory Visit | Attending: Family Medicine | Admitting: Family Medicine

## 2019-10-21 ENCOUNTER — Other Ambulatory Visit: Payer: Self-pay

## 2019-10-21 DIAGNOSIS — Z1231 Encounter for screening mammogram for malignant neoplasm of breast: Secondary | ICD-10-CM

## 2019-12-15 ENCOUNTER — Ambulatory Visit: Payer: Self-pay | Admitting: Allergy

## 2019-12-17 ENCOUNTER — Ambulatory Visit: Payer: Self-pay | Admitting: Allergy

## 2020-06-15 IMAGING — MG DIGITAL SCREENING BILAT W/ TOMO W/ CAD
8 series · 8 of 24 positions shown · non-contrast
Comparison: Previous exam(s).

CLINICAL DATA: Screening.

EXAM:
DIGITAL SCREENING BILATERAL MAMMOGRAM WITH TOMO AND CAD

[R CC synth-2D]
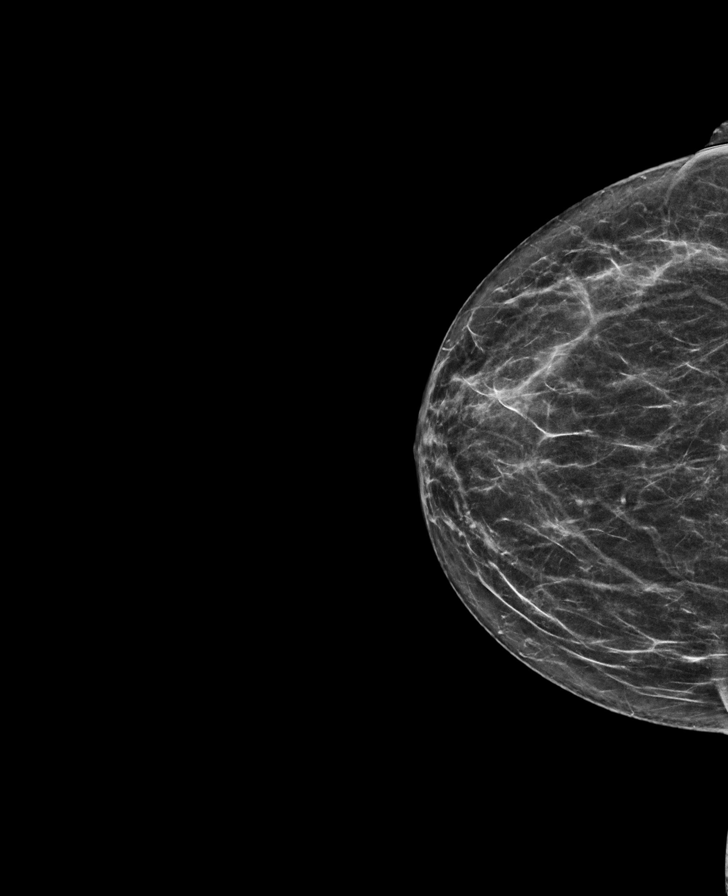

[R MLO synth-2D]
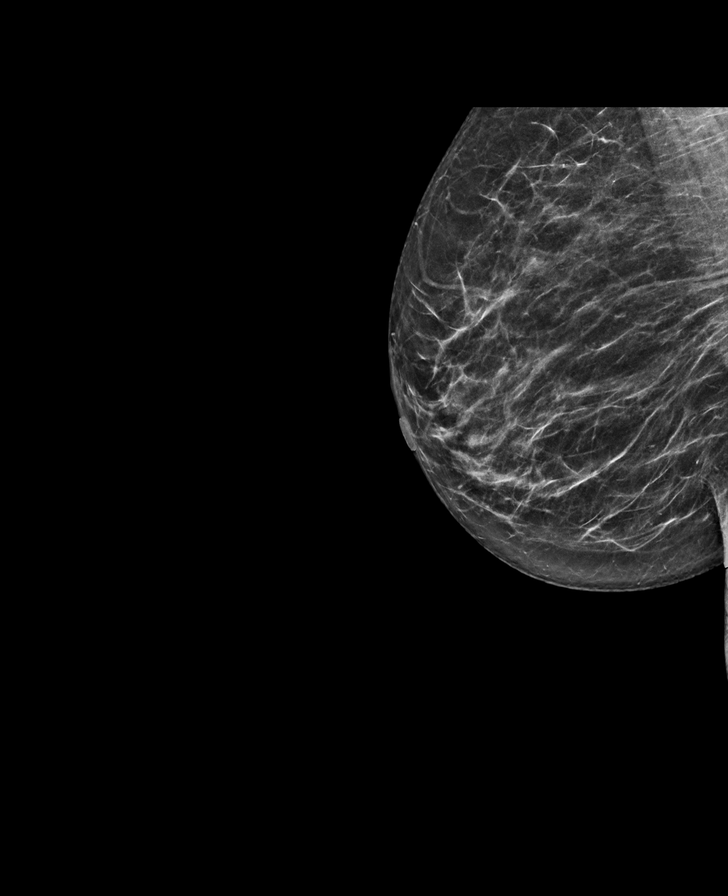

[L MLO synth-2D]
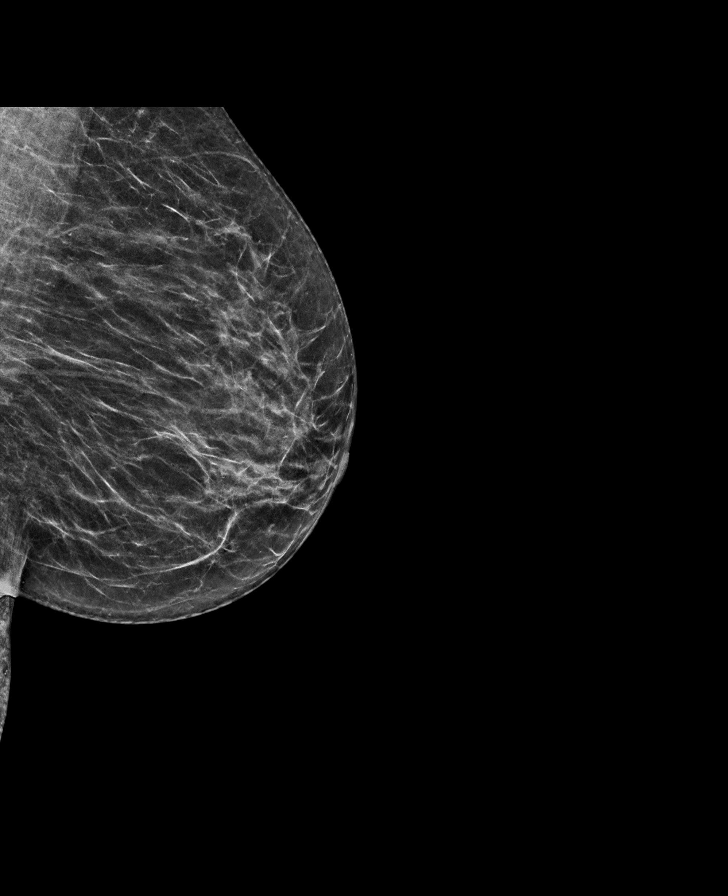

[L CC synth-2D]
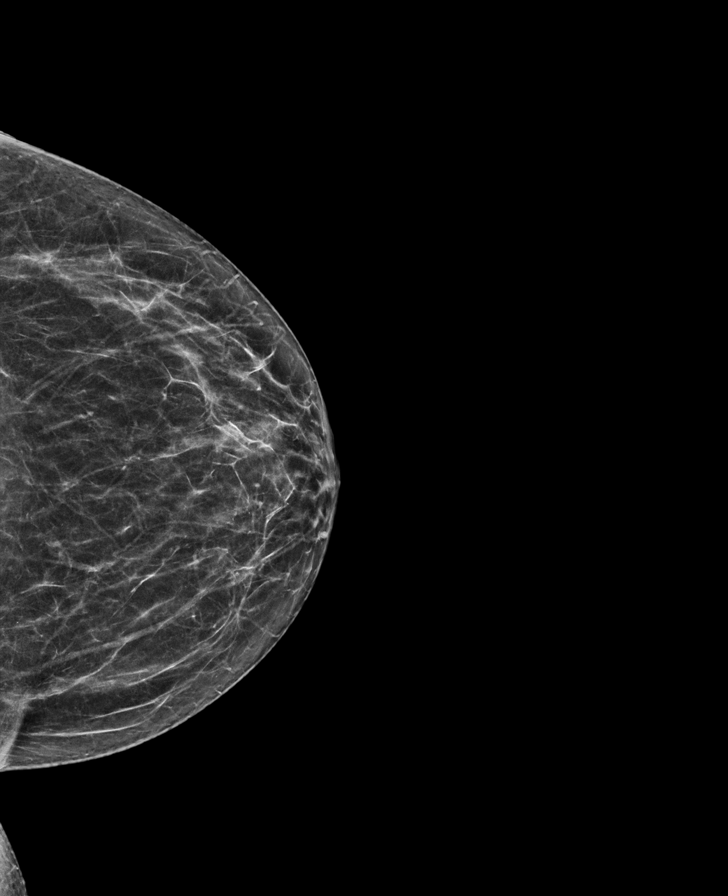

[R CC tomo · tomo slice 30/59.0]
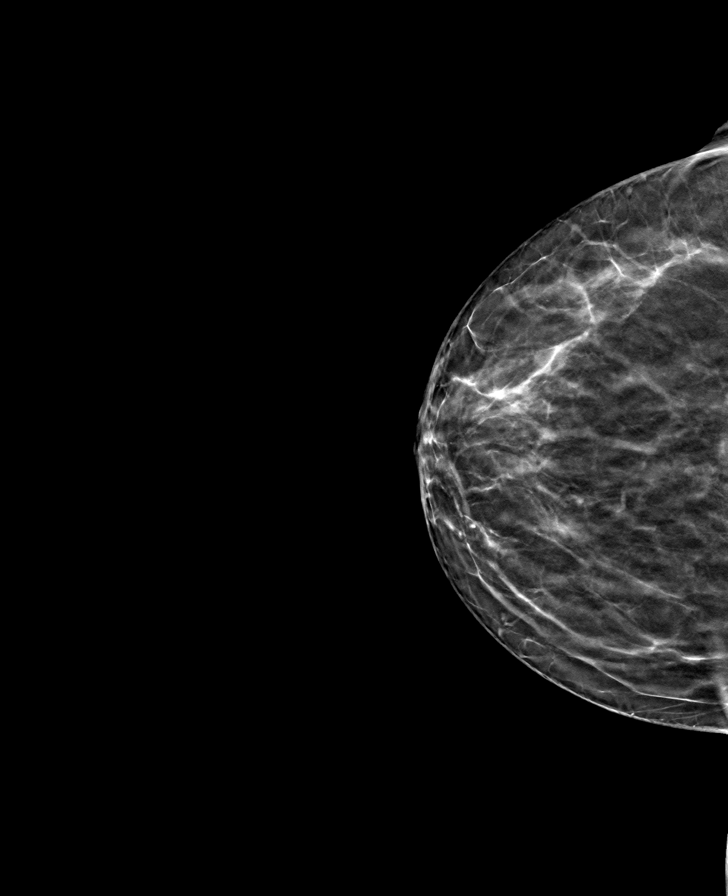

[R MLO tomo · tomo slice 29/58.0]
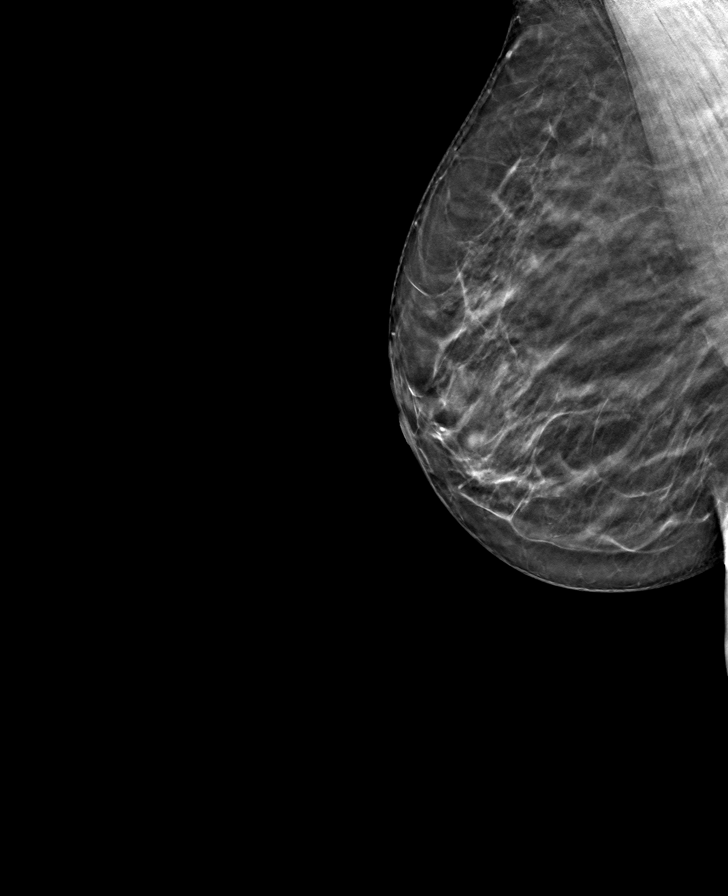

[L MLO tomo · tomo slice 30/59.0]
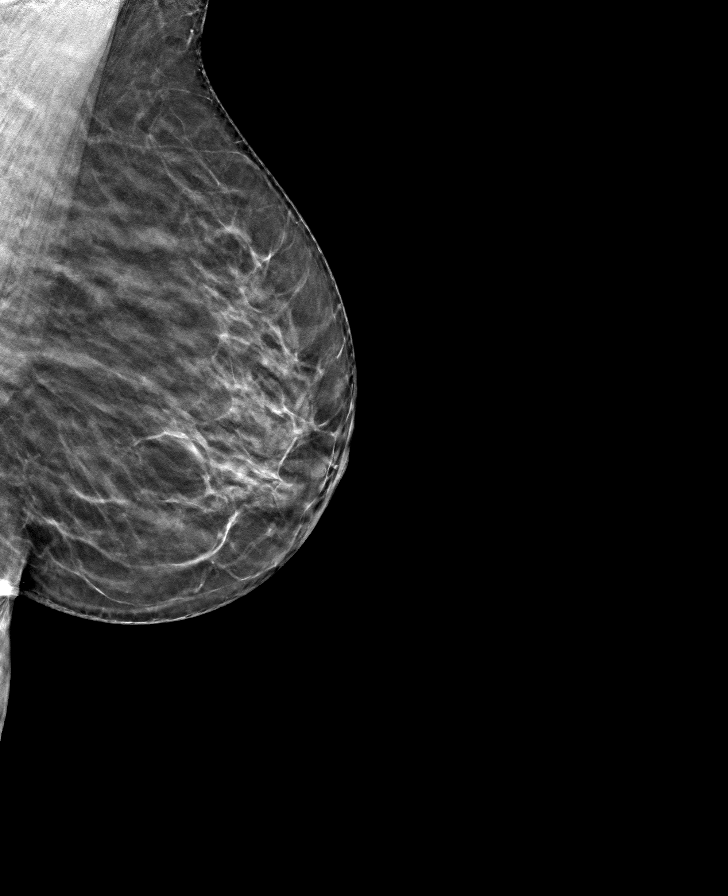

[L CC tomo · tomo slice 29/56.0]
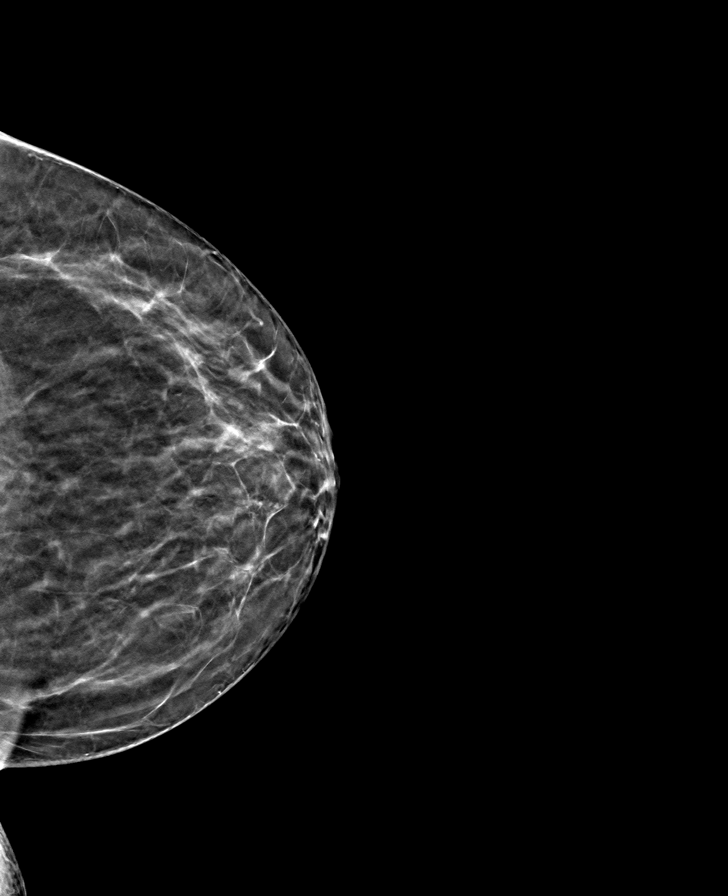

[8 of 24 positions shown; findings below may reference images not displayed]

ACR Breast Density Category c: The breast tissue is heterogeneously
dense, which may obscure small masses.
FINDINGS: There are no findings suspicious for malignancy. Images were
processed with CAD.
IMPRESSION: No mammographic evidence of malignancy. A result letter of this
screening mammogram will be mailed directly to the patient.

RECOMMENDATION:
Screening mammogram in one year. (Code:[id number]-[id number])

BI-RADS CATEGORY  1: Negative.

## 2020-10-26 ENCOUNTER — Ambulatory Visit
Admission: EM | Admit: 2020-10-26 | Discharge: 2020-10-26 | Disposition: A | Discharge status: Home / Self Care | Payer: Medicare HMO

## 2020-10-26 ENCOUNTER — Other Ambulatory Visit: Payer: Self-pay

## 2020-10-26 ENCOUNTER — Emergency Department (HOSPITAL_COMMUNITY): Payer: Medicare HMO

## 2020-10-26 ENCOUNTER — Emergency Department (HOSPITAL_COMMUNITY)
Admission: EM | Admit: 2020-10-26 | Discharge: 2020-10-27 | Disposition: A | Discharge status: Home / Self Care | Payer: Medicare HMO | Attending: Emergency Medicine | Admitting: Emergency Medicine

## 2020-10-26 ENCOUNTER — Encounter (HOSPITAL_COMMUNITY): Payer: Self-pay | Admitting: Emergency Medicine

## 2020-10-26 DIAGNOSIS — R112 Nausea with vomiting, unspecified: Secondary | ICD-10-CM | POA: Insufficient documentation

## 2020-10-26 DIAGNOSIS — I517 Cardiomegaly: Secondary | ICD-10-CM

## 2020-10-26 DIAGNOSIS — E876 Hypokalemia: Secondary | ICD-10-CM | POA: Diagnosis not present

## 2020-10-26 DIAGNOSIS — R748 Abnormal levels of other serum enzymes: Secondary | ICD-10-CM | POA: Insufficient documentation

## 2020-10-26 DIAGNOSIS — R1010 Upper abdominal pain, unspecified: Secondary | ICD-10-CM | POA: Diagnosis not present

## 2020-10-26 DIAGNOSIS — R778 Other specified abnormalities of plasma proteins: Secondary | ICD-10-CM

## 2020-10-26 DIAGNOSIS — I10 Essential (primary) hypertension: Secondary | ICD-10-CM | POA: Insufficient documentation

## 2020-10-26 DIAGNOSIS — R197 Diarrhea, unspecified: Secondary | ICD-10-CM | POA: Insufficient documentation

## 2020-10-26 LAB — CBC WITH DIFFERENTIAL/PLATELET
Abs Immature Granulocytes: 0.06 10*3/uL (ref 0.00–0.07)
Basophils Absolute: 0 10*3/uL (ref 0.0–0.1)
Basophils Relative: 0 %
Eosinophils Absolute: 0 10*3/uL (ref 0.0–0.5)
Eosinophils Relative: 0 %
HCT: 41.5 % (ref 36.0–46.0)
Hemoglobin: 14 g/dL (ref 12.0–15.0)
Immature Granulocytes: 1 %
Lymphocytes Relative: 13 %
Lymphs Abs: 1.7 10*3/uL (ref 0.7–4.0)
MCH: 27.3 pg (ref 26.0–34.0)
MCHC: 33.7 g/dL (ref 30.0–36.0)
MCV: 80.9 fL (ref 80.0–100.0)
Monocytes Absolute: 1.2 10*3/uL — ABNORMAL HIGH (ref 0.1–1.0)
Monocytes Relative: 9 %
Neutro Abs: 10.1 10*3/uL — ABNORMAL HIGH (ref 1.7–7.7)
Neutrophils Relative %: 77 %
Platelets: 308 10*3/uL (ref 150–400)
RBC: 5.13 MIL/uL — ABNORMAL HIGH (ref 3.87–5.11)
RDW: 13.6 % (ref 11.5–15.5)
WBC: 13.1 10*3/uL — ABNORMAL HIGH (ref 4.0–10.5)
nRBC: 0 % (ref 0.0–0.2)

## 2020-10-26 LAB — COMPREHENSIVE METABOLIC PANEL
ALT: 14 U/L (ref 0–44)
AST: 19 U/L (ref 15–41)
Albumin: 4.1 g/dL (ref 3.5–5.0)
Alkaline Phosphatase: 40 U/L (ref 38–126)
Anion gap: 12 (ref 5–15)
BUN: 10 mg/dL (ref 8–23)
CO2: 29 mmol/L (ref 22–32)
Calcium: 9.8 mg/dL (ref 8.9–10.3)
Chloride: 91 mmol/L — ABNORMAL LOW (ref 98–111)
Creatinine, Ser: 0.73 mg/dL (ref 0.44–1.00)
GFR, Estimated: >60 mL/min (ref >60)
Glucose, Bld: 124 mg/dL — ABNORMAL HIGH (ref 70–99)
Potassium: 2.8 mmol/L — ABNORMAL LOW (ref 3.5–5.1)
Sodium: 132 mmol/L — ABNORMAL LOW (ref 135–145)
Total Bilirubin: 0.9 mg/dL (ref 0.3–1.2)
Total Protein: 7.8 g/dL (ref 6.5–8.1)

## 2020-10-26 LAB — TROPONIN I (HIGH SENSITIVITY): Troponin I (High Sensitivity): 30 ng/L — ABNORMAL HIGH (ref <18)

## 2020-10-26 LAB — LIPASE, BLOOD: Lipase: 22 U/L (ref 11–51)

## 2020-10-26 NOTE — ED Provider Notes (Signed)
Emergency Medicine Provider Triage Evaluation Note  Robin Scott , a 70 y.o. female  was evaluated in triage.  Pt complains of abdominal pain beginning last night.  States she was seen by an urgent care and then was called today telling her she had an abnormal lab result.  She thinks it was an elevated cardiac enzyme.  Review of Systems  Positive: Lab abnormality, abdominal pain Negative: Chest pain, shortness of breath, syncope, dizziness  Physical Exam  BP (!) 157/106   Pulse 93   Temp 98.9 F (37.2 C) (Oral)   Resp 16   Ht 5\' 2"  (1.575 m)   Wt 72 kg   SpO2 99%   BMI 29.03 kg/m   Gen:   Awake, no distress   Resp:  Normal effort  MSK:   Moves extremities without difficulty  Other:    Medical Decision Making  Medically screening exam initiated at 9:20 PM.  Appropriate orders placed.  Dulce Martian was informed that the remainder of the evaluation will be completed by another provider, this initial triage assessment does not replace that evaluation, and the importance of remaining in the ED until their evaluation is complete.     Lonell Grandchild, PA-C 10/26/20 2222    2223, MD 10/27/20 10/29/20

## 2020-10-26 NOTE — ED Triage Notes (Addendum)
Patient reports generalized abdominal pain yesterday , no emesis , intermittent diarrhea , seen at a local urgent care prescribed with oral antibiotic , received a call today to go to ER due to abnormal blood test result.

## 2020-10-27 LAB — TROPONIN I (HIGH SENSITIVITY): Troponin I (High Sensitivity): 29 ng/L — ABNORMAL HIGH (ref <18)

## 2020-10-27 NOTE — ED Notes (Signed)
Patient verbalizes understanding of discharge instructions. Opportunity for questioning and answers were provided. Armband removed by staff, pt discharged from ED ambulatory.   

## 2020-10-27 NOTE — Discharge Instructions (Signed)
Follow up with your doctor for further outpatient evaluation as discussed.   Return to the ED with any new or concerning symptoms at any time.

## 2020-10-27 NOTE — ED Provider Notes (Signed)
[facility] EMERGENCY DEPARTMENT Provider Note   CSN: [id number] Arrival date & time: 10/26/20  2035     History Chief Complaint  Patient presents with  . Abdominal Pain    Abnormal Blood Test     Robin Scott is a 70 y.o. female.  Patient to ED after being seen at a [facility] earlier today for abdominal pain, N, V, D and being contacted later by the provider instructing her to go to the emergency room secondary to an abnormal lab finding. The patient reports she developed upper abdominal pain yesterday after eating steak for dinner, with subsequent vomiting and loose stool. No fever. Symptoms have subsided now. She states she was told a cardiac enzyme was elevated and she would need further evaluation. She denies chest pain, SOB, cough. No history of heart disease.   The history is provided by the patient. No language interpreter was used.  Abdominal Pain Associated symptoms: no chest pain, no cough, no diarrhea, no fever, no shortness of breath and no vomiting        Past Medical History:  Diagnosis Date  . Hypertension     Patient Active Problem List   Diagnosis Date Noted  . Pneumococcal vaccination declined by patient 07/31/2016  . Essential hypertension 11/11/2008    History reviewed. No pertinent surgical history.   OB History   No obstetric history on file.     No family history on file.  Social History   Tobacco Use  . Smoking status: Never Smoker  . Smokeless tobacco: Never Used    Home Medications Prior to Admission medications   Medication Sig Start Date End Date Taking? Authorizing Provider  losartan (COZAAR) 25 MG tablet Take 1 tablet by mouth daily. 06/24/19   [provider]    Allergies    Patient has no known allergies.  Review of Systems   Review of Systems  Constitutional: Negative for fever.  HENT: Negative.   Respiratory: Negative for cough and shortness of breath.   Cardiovascular: Negative  for chest pain.  Gastrointestinal: Positive for abdominal pain. Negative for diarrhea and vomiting.  Musculoskeletal: Negative.   Neurological: Negative for weakness and light-headedness.    Physical Exam Updated Vital Signs BP (!) 145/100 (BP Location: Right Arm)   Pulse 88   Temp 98.4 F (36.9 C) (Oral)   Resp 16   Ht 5\' 2"  (1.575 m)   Wt 72 kg   SpO2 99%   BMI 29.03 kg/m   Physical Exam Vitals and nursing note reviewed.  Constitutional:      Appearance: She is well-developed.  HENT:     Head: Normocephalic.  Cardiovascular:     Rate and Rhythm: Normal rate and regular rhythm.  Pulmonary:     Effort: Pulmonary effort is normal.     Breath sounds: Normal breath sounds.  Abdominal:     General: There is no distension.     Palpations: Abdomen is soft.     Tenderness: There is abdominal tenderness (Mild. No RUQ tenderness) in the epigastric area. There is no guarding or rebound.  Musculoskeletal:        General: Normal range of motion.     Cervical back: Normal range of motion and neck supple.  Skin:    General: Skin is warm and dry.     Findings: No rash.  Neurological:     Mental Status: She is alert.     Cranial Nerves: No cranial nerve deficit.  ED Results / Procedures / Treatments   Labs (all labs ordered are listed, but only abnormal results are displayed) Labs Reviewed  COMPREHENSIVE METABOLIC PANEL - Abnormal; Notable for the following components:      Result Value   Sodium 132 (*)    Potassium 2.8 (*)    Chloride 91 (*)    Glucose, Bld 124 (*)    All other components within normal limits  CBC WITH DIFFERENTIAL/PLATELET - Abnormal; Notable for the following components:   WBC 13.1 (*)    RBC 5.13 (*)    Neutro Abs 10.1 (*)    Monocytes Absolute 1.2 (*)    All other components within normal limits  TROPONIN I (HIGH SENSITIVITY) - Abnormal; Notable for the following components:   Troponin I (High Sensitivity) 30 (*)    All other components within  normal limits  TROPONIN I (HIGH SENSITIVITY) - Abnormal; Notable for the following components:   Troponin I (High Sensitivity) 29 (*)    All other components within normal limits  LIPASE, BLOOD   Results for orders placed or performed during the hospital encounter of 10/26/20  Comprehensive metabolic panel  Result Value Ref Range   Sodium 132 (L) 135 - 145 mmol/L   Potassium 2.8 (L) 3.5 - 5.1 mmol/L   Chloride 91 (L) 98 - 111 mmol/L   CO2 29 22 - 32 mmol/L   Glucose, Bld 124 (H) 70 - 99 mg/dL   BUN 10 8 - 23 mg/dL   Creatinine, Ser 3.01 0.44 - 1.00 mg/dL   Calcium 9.8 8.9 - 31.4 mg/dL   Total Protein 7.8 6.5 - 8.1 g/dL   Albumin 4.1 3.5 - 5.0 g/dL   AST 19 15 - 41 U/L   ALT 14 0 - 44 U/L   Alkaline Phosphatase 40 38 - 126 U/L   Total Bilirubin 0.9 0.3 - 1.2 mg/dL   GFR, Estimated >38 >88 mL/min   Anion gap 12 5 - 15  Lipase, blood  Result Value Ref Range   Lipase 22 11 - 51 U/L  CBC with Differential  Result Value Ref Range   WBC 13.1 (H) 4.0 - 10.5 K/uL   RBC 5.13 (H) 3.87 - 5.11 MIL/uL   Hemoglobin 14.0 12.0 - 15.0 g/dL   HCT 75.7 97.2 - 82.0 %   MCV 80.9 80.0 - 100.0 fL   MCH 27.3 26.0 - 34.0 pg   MCHC 33.7 30.0 - 36.0 g/dL   RDW 60.1 56.1 - 53.7 %   Platelets 308 150 - 400 K/uL   nRBC 0.0 0.0 - 0.2 %   Neutrophils Relative % 77 %   Neutro Abs 10.1 (H) 1.7 - 7.7 K/uL   Lymphocytes Relative 13 %   Lymphs Abs 1.7 0.7 - 4.0 K/uL   Monocytes Relative 9 %   Monocytes Absolute 1.2 (H) 0.1 - 1.0 K/uL   Eosinophils Relative 0 %   Eosinophils Absolute 0.0 0.0 - 0.5 K/uL   Basophils Relative 0 %   Basophils Absolute 0.0 0.0 - 0.1 K/uL   Immature Granulocytes 1 %   Abs Immature Granulocytes 0.06 0.00 - 0.07 K/uL  Troponin I (High Sensitivity)  Result Value Ref Range   Troponin I (High Sensitivity) 30 (H) <18 ng/L  Troponin I (High Sensitivity)  Result Value Ref Range   Troponin I (High Sensitivity) 29 (H) <18 ng/L     EKG EKG Interpretation  Date/Time:  Tuesday  Oct 26 2020 21:26:26 EDT Ventricular Rate:  88 PR Interval:  154 QRS Duration: 82 QT Interval:  378 QTC Calculation: 457 R Axis:   3 Text Interpretation: Normal sinus rhythm Left ventricular hypertrophy with repolarization abnormality ( R in aVL , Cornell product ) Abnormal ECG No old tracing to compare Confirmed by Marily Memos [phone]) on 10/27/2020 2:44:53 AM   Radiology DG Chest 2 View  Result Date: 10/26/2020 CLINICAL DATA:  Chest and abdominal pain. EXAM: CHEST - 2 VIEW COMPARISON:  Radiograph 07/09/2020 FINDINGS: Borderline cardiomegaly. Aortic atherosclerosis and tortuosity, unchanged. Chronic interstitial thickening, similar to prior. No pleural effusion or pneumothorax. No acute osseous abnormalities are seen. IMPRESSION: 1. No acute chest findings.  Chronic interstitial coarsening. 2. Borderline cardiomegaly, unchanged. Electronically Signed   By: Narda Rutherford M.D.   On: 10/26/2020 21:50    Procedures Procedures   Medications Ordered in ED Medications - No data to display  ED Course  I have reviewed the triage vital signs and the nursing notes.  Pertinent labs & imaging results that were available during my care of the patient were reviewed by me and considered in my medical decision making (see chart for details).    MDM Rules/Calculators/A&P                          Patient to ED for evaluation of elevated cardiac enzyme from testing done at an Urgent Care earlier today in evaluation of abdominal pain, N, V, D.   The patient informs her previous symptoms have all but resolved. She had not taken any medications for same. She denies chest pain, SOB at any time. Abdominal exam currently is benign.   Chart reviewed. Troponin done earlier today found to be 25. Two additional troponins performed in the ED are 30 and 29. EKG with nonspecific ST-Twave depression, no old EKG to compare, possible repol. Mild leukocytosis. Hypokalemia of 2.8, however, lab done earlier showed a  4.1 K+.   She is felt appropriate for discharge home. Discussed PCP follow up prn. Discussed return precautions.   ADDENDUM: patient was discharged without discussion of her potassium level. Here it resulted as 2.8, however, earlier in the day it was 4.1. The patient was called and a voice message was left that she would need to have her PCP recheck this level in the next 24 to 48 hours to verify any need for supplementation.    Final Clinical Impression(s) / ED Diagnoses Final diagnoses:  None   1. Elevated troponin 2. Hypokalemia  Rx / DC Orders ED Discharge Orders    None       Danne Harbor 10/27/20 0701    Mesner, Barbara Cower, MD 10/27/20 [phone]

## 2021-03-11 ENCOUNTER — Other Ambulatory Visit: Payer: Self-pay | Admitting: Family Medicine

## 2021-03-11 DIAGNOSIS — Z1231 Encounter for screening mammogram for malignant neoplasm of breast: Secondary | ICD-10-CM

## 2023-01-01 ENCOUNTER — Other Ambulatory Visit: Payer: Self-pay | Admitting: Family Medicine

## 2023-01-01 DIAGNOSIS — M5416 Radiculopathy, lumbar region: Secondary | ICD-10-CM

## 2023-01-16 ENCOUNTER — Encounter: Payer: Self-pay | Admitting: Family Medicine

## 2023-01-29 ENCOUNTER — Ambulatory Visit
Admission: RE | Admit: 2023-01-29 | Discharge: 2023-01-29 | Disposition: A | Discharge status: Home / Self Care | Payer: Medicare HMO | Source: Ambulatory Visit | Attending: Family Medicine | Admitting: Family Medicine

## 2023-01-29 DIAGNOSIS — M5416 Radiculopathy, lumbar region: Secondary | ICD-10-CM

## 2023-07-28 ENCOUNTER — Encounter: Payer: Self-pay | Admitting: Emergency Medicine

## 2023-07-28 ENCOUNTER — Other Ambulatory Visit: Payer: Self-pay

## 2023-07-28 ENCOUNTER — Ambulatory Visit
Admission: EM | Admit: 2023-07-28 | Discharge: 2023-07-28 | Disposition: A | Discharge status: Home / Self Care | Payer: Medicare HMO | Attending: Family Medicine | Admitting: Family Medicine

## 2023-07-28 DIAGNOSIS — I889 Nonspecific lymphadenitis, unspecified: Secondary | ICD-10-CM | POA: Diagnosis not present

## 2023-07-28 DIAGNOSIS — J019 Acute sinusitis, unspecified: Secondary | ICD-10-CM

## 2023-07-28 MED ORDER — AMOXICILLIN-POT CLAVULANATE 875-125 MG PO TABS: 1.0000 | ORAL_TABLET | Freq: Two times a day (BID) | ORAL | 0 refills | Status: AC

## 2023-07-28 MED ORDER — BENZONATATE 100 MG PO CAPS: 100.0000 mg | ORAL_CAPSULE | Freq: Three times a day (TID) | ORAL | 0 refills | Status: AC | PRN

## 2023-07-28 NOTE — Discharge Instructions (Signed)
 Take amoxicillin-clavulanate 875 mg--1 tab twice daily with food for 7 days  Take benzonatate 100 mg, 1 tab every 8 hours as needed for cough.  The swelling could be due to a blocked duct and a salivary gland.  Suck on some sour candies through the day in the next couple of days.  Follow-up with your primary care  If that area enlarges at all, instead of improving, please go to the emergency room.

## 2023-07-28 NOTE — ED Triage Notes (Signed)
 Cough for a week, phlegm is light yellow.  Left side of neck is swollen.  Saw pcp yesterday.  This swelling has increased in size since yesterday.

## 2023-07-28 NOTE — ED Provider Notes (Signed)
 [facility]    CSN: [id number] Arrival date & time: 07/28/23  1107      History   Chief Complaint Chief Complaint  Patient presents with   Cough    HPI Robin Scott is a 73 y.o. female.    Cough Here for cough and congestion and swelling in her left neck.  She has had cough and congestion for about 7 days now.  Home flu and COVID test was negative.  There she saw her primary care yesterday at which time she had some mild swelling under her left chin and jaw.  Albuterol was sent in for her symptoms yesterday but no steroid prescription was done as she does not tolerate prednisone.  Today she comes in because the swelling under her left jaw has gotten bigger.  It is mildly tender.  No fever  Albuterol inhaler is helping.   Past Medical History:  Diagnosis Date   Hypertension     Patient Active Problem List   Diagnosis Date Noted   Pneumococcal vaccination declined by patient 07/31/2016   Essential hypertension 11/11/2008    Past Surgical History:  Procedure Laterality Date   ABDOMINAL HYSTERECTOMY      OB History   No obstetric history on file.      Home Medications    Prior to Admission medications   Medication Sig Start Date End Date Taking? Authorizing Provider  albuterol (VENTOLIN HFA) 108 (90 Base) MCG/ACT inhaler Inhale into the lungs. 06/20/22  Yes [provider]  amoxicillin-clavulanate (AUGMENTIN) 875-125 MG tablet Take 1 tablet by mouth 2 (two) times daily for 7 days. 07/28/23 08/04/23 Yes Skyleen Bentley, Janace Aris, MD  benzonatate (TESSALON) 100 MG capsule Take 1 capsule (100 mg total) by mouth 3 (three) times daily as needed for cough. 07/28/23  Yes Zenia Resides, MD  losartan (COZAAR) 25 MG tablet Take 1 tablet by mouth daily. 06/24/19   [provider]    Family History History reviewed. No pertinent family history.  Social History Social History   Tobacco Use   Smoking status: Never   Smokeless tobacco:  Never  Vaping Use   Vaping status: Never Used  Substance Use Topics   Alcohol use: Never   Drug use: Never     Allergies   Prednisone   Review of Systems Review of Systems  Respiratory:  Positive for cough.      Physical Exam Triage Vital Signs ED Triage Vitals  Encounter Vitals Group     BP 07/28/23 1117 137/76     Systolic BP Percentile --      Diastolic BP Percentile --      Pulse Rate 07/28/23 1117 72     Resp 07/28/23 1117 20     Temp 07/28/23 1117 98.8 F (37.1 C)     Temp Source 07/28/23 1117 Oral     SpO2 07/28/23 1117 94 %     Weight --      Height --      Head Circumference --      Peak Flow --      Pain Score 07/28/23 1137 6     Pain Loc --      Pain Education --      Exclude from Growth Chart --    No data found.  Updated Vital Signs BP 137/76 (BP Location: Right Arm)   Pulse 72   Temp 98.8 F (37.1 C) (Oral)   Resp 20   SpO2 94%   Visual  Acuity Right Eye Distance:   Left Eye Distance:   Bilateral Distance:    Right Eye Near:   Left Eye Near:    Bilateral Near:     Physical Exam Vitals reviewed.  Constitutional:      General: She is not in acute distress.    Appearance: She is not toxic-appearing.  HENT:     Right Ear: Tympanic membrane and ear canal normal.     Left Ear: Tympanic membrane and ear canal normal.     Nose: Congestion present.     Mouth/Throat:     Mouth: Mucous membranes are moist.     Pharynx: No oropharyngeal exudate or posterior oropharyngeal erythema.  Eyes:     Extraocular Movements: Extraocular movements intact.     Conjunctiva/sclera: Conjunctivae normal.     Pupils: Pupils are equal, round, and reactive to light.  Neck:     Comments: There is swelling about 5 x 3 cm under her left jaw and chin.  It is tender.  No fluctuance.  There is no erythema. Cardiovascular:     Rate and Rhythm: Normal rate and regular rhythm.     Heart sounds: No murmur heard. Pulmonary:     Effort: Pulmonary effort is normal.  No respiratory distress.     Breath sounds: No stridor. No wheezing, rhonchi or rales.  Musculoskeletal:     Cervical back: Neck supple.  Skin:    Capillary Refill: Capillary refill takes less than 2 seconds.     Coloration: Skin is not jaundiced or pale.  Neurological:     General: No focal deficit present.     Mental Status: She is alert and oriented to person, place, and time.  Psychiatric:        Behavior: Behavior normal.      UC Treatments / Results  Labs (all labs ordered are listed, but only abnormal results are displayed) Labs Reviewed - No data to display  EKG   Radiology No results found.  Procedures Procedures (including critical care time)  Medications Ordered in UC Medications - No data to display  Initial Impression / Assessment and Plan / UC Course  I have reviewed the triage vital signs and the nursing notes.  Pertinent labs & imaging results that were available during my care of the patient were reviewed by me and considered in my medical decision making (see chart for details).     Augmentin is sent in to treat possible sinusitis and possible lymphadenitis.  I discussed with her that this could also be swelling from a blocked salivary gland duct.  She will suck on some sour candies.  She will follow-up with her primary care  We decided against trying to do any sort of steroid treatment since she is not wheezing at this time. Final Clinical Impressions(s) / UC Diagnoses   Final diagnoses:  Lymphadenitis  Acute sinusitis, recurrence not specified, unspecified location     Discharge Instructions      Take amoxicillin-clavulanate 875 mg--1 tab twice daily with food for 7 days  Take benzonatate 100 mg, 1 tab every 8 hours as needed for cough.  The swelling could be due to a blocked duct and a salivary gland.  Suck on some sour candies through the day in the next couple of days.  Follow-up with your primary care  If that area enlarges at all,  instead of improving, please go to the emergency room.     ED Prescriptions     Medication  Sig Dispense Auth. Provider   amoxicillin-clavulanate (AUGMENTIN) 875-125 MG tablet Take 1 tablet by mouth 2 (two) times daily for 7 days. 14 tablet Wandy Bossler, Janace Aris, MD   benzonatate (TESSALON) 100 MG capsule Take 1 capsule (100 mg total) by mouth 3 (three) times daily as needed for cough. 21 capsule Zenia Resides, MD      PDMP not reviewed this encounter.   Zenia Resides, MD 07/28/23 [phone]
